# Patient Record
Sex: Female | Born: 2000 | Hispanic: No | Marital: Single | Smoking: Never smoker
Health system: Southern US, Community
[De-identification: ages and names within clinical notes are randomized; demographics above are authoritative.]

## PROBLEM LIST (undated history)

## (undated) DIAGNOSIS — J45909 Unspecified asthma, uncomplicated: Secondary | ICD-10-CM

## (undated) DIAGNOSIS — F32A Depression, unspecified: Secondary | ICD-10-CM

## (undated) HISTORY — PX: OTHER SURGICAL HISTORY: SHX169

---
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
## 2022-06-02 ENCOUNTER — Ambulatory Visit
Admission: RE | Admit: 2022-06-02 | Discharge: 2022-06-02 | Disposition: A | Discharge status: Home / Self Care | Payer: Medicaid Other | Source: Ambulatory Visit | Attending: Family Medicine | Admitting: Family Medicine

## 2022-06-02 VITALS — BP 116/77 | HR 92 | Temp 98.0°F | Resp 16

## 2022-06-02 DIAGNOSIS — J0140 Acute pansinusitis, unspecified: Secondary | ICD-10-CM | POA: Diagnosis not present

## 2022-06-02 DIAGNOSIS — J029 Acute pharyngitis, unspecified: Secondary | ICD-10-CM

## 2022-06-02 LAB — POCT RAPID STREP A (OFFICE): Rapid Strep A Screen: NEGATIVE

## 2022-06-02 MED ORDER — LEVOCETIRIZINE DIHYDROCHLORIDE 5 MG PO TABS: 5.0000 mg | ORAL_TABLET | Freq: Every evening | ORAL | 0 refills | Status: DC

## 2022-06-02 MED ORDER — AZITHROMYCIN 250 MG PO TABS: ORAL_TABLET | ORAL | 0 refills | Status: DC

## 2022-06-02 NOTE — ED Provider Notes (Signed)
Renaldo Fiddler    CSN: [id number] Arrival date & time: 06/02/22  1813      History   Chief Complaint Chief Complaint  Patient presents with   Sore Throat    Had a sore Throat for about 2weeks coughing up green mucus has a stuffy nose and puss on tonsils - Entered by patient    HPI Jamie Lindsey is a 21 y.o. female.   HPI Patient presents for evaluation of sore throat, stuffy nose, and coughing- up green mucous. She is not taking medication as she feels medication does not help her symptoms.  She reports allergy type symptoms intermittently for several months. She denies fever.   History reviewed. No pertinent past medical history.  There are no problems to display for this patient.   History reviewed. No pertinent surgical history.  OB History   No obstetric history on file.      Home Medications    Prior to Admission medications   Medication Sig Start Date End Date Taking? Authorizing Provider  azithromycin (ZITHROMAX) 250 MG tablet Take 2 tabs PO x 1 dose, then 1 tab PO QD x 4 days 06/02/22  Yes Bing Neighbors, FNP  levocetirizine (XYZAL) 5 MG tablet Take 1 tablet (5 mg total) by mouth every evening. 06/02/22  Yes Bing Neighbors, FNP  dicyclomine (BENTYL) 10 MG capsule Take 1 capsule (10 mg total) by mouth 4 (four) times daily for 5 days. 05/04/21 05/09/21  Gilles Chiquito, MD  escitalopram (LEXAPRO) 10 MG tablet Take 10 mg by mouth daily. Patient not taking: Reported on 05/16/2021    [provider]  escitalopram (LEXAPRO) 20 MG tablet Take 1 tablet by mouth daily. 02/01/21   [provider]  magic mouthwash w/lidocaine SOLN Take 5 mLs by mouth 4 (four) times daily as needed for mouth pain. Patient not taking: Reported on 05/16/2021 01/07/17   Darci Current, MD  ondansetron (ZOFRAN) 4 MG tablet Take 1 tablet (4 mg total) by mouth every 8 (eight) hours as needed for up to 10 doses for nausea or vomiting. 05/04/21   Gilles Chiquito, MD   ZAFEMY 150-35 MCG/24HR transdermal patch Place onto the skin. 04/01/21   [provider]    Family History History reviewed. No pertinent family history.  Social History Social History   Tobacco Use   Smoking status: Never   Smokeless tobacco: Never  Substance Use Topics   Alcohol use: No     Allergies   Tylenol [acetaminophen], Penicillins, and Tomato   Review of Systems Review of Systems Pertinent negatives listed in HPI   Physical Exam Triage Vital Signs ED Triage Vitals  Enc Vitals Group     BP 06/02/22 1900 116/77     Pulse Rate 06/02/22 1900 92     Resp 06/02/22 1900 16     Temp 06/02/22 1900 98 F (36.7 C)     Temp Source 06/02/22 1900 Temporal     SpO2 06/02/22 1900 100 %     Weight --      Height --      Head Circumference --      Peak Flow --      Pain Score 06/02/22 1859 0     Pain Loc --      Pain Edu? --      Excl. in GC? --    No data found.  Updated Vital Signs BP 116/77 (BP Location: Left Arm)   Pulse 92  Temp 98 F (36.7 C) (Temporal)   Resp 16   LMP 05/26/2022   SpO2 100%   Visual Acuity Right Eye Distance:   Left Eye Distance:   Bilateral Distance:    Right Eye Near:   Left Eye Near:    Bilateral Near:     Physical Exam Constitutional:      Appearance: She is well-developed.  HENT:     Head: Normocephalic and atraumatic.     Right Ear: Tympanic membrane and ear canal normal.     Left Ear: Tympanic membrane and ear canal normal.     Nose: Congestion and rhinorrhea present.     Mouth/Throat:     Pharynx: No posterior oropharyngeal erythema or uvula swelling.     Tonsils: Tonsillar exudate present.  Eyes:     Conjunctiva/sclera: Conjunctivae normal.     Pupils: Pupils are equal, round, and reactive to light.  Cardiovascular:     Rate and Rhythm: Normal rate and regular rhythm.  Pulmonary:     Effort: Pulmonary effort is normal.     Breath sounds: Normal breath sounds.  Musculoskeletal:     Cervical back:  Normal range of motion and neck supple.  Lymphadenopathy:     Cervical: Cervical adenopathy present.  Skin:    General: Skin is warm and dry.     Capillary Refill: Capillary refill takes less than 2 seconds.  Neurological:     General: No focal deficit present.     Mental Status: She is alert.  Psychiatric:        Mood and Affect: Mood normal.      UC Treatments / Results  Labs (all labs ordered are listed, but only abnormal results are displayed) Labs Reviewed  POCT RAPID STREP A (OFFICE)    EKG   Radiology No results found.  Procedures Procedures (including critical care time)  Medications Ordered in UC Medications - No data to display  Initial Impression / Assessment and Plan / UC Course  I have reviewed the triage vital signs and the nursing notes.  Pertinent labs & imaging results that were available during my care of the patient were reviewed by me and considered in my medical decision making (see chart for details).    Acute Non-Recurrent Pansinusitis  Acute Pharyngitis  Treatment with Azithromycin and Levocetirizine Hydrate well with fluids. Return for evaluation if symptoms worsen or do not improve. Final Clinical Impressions(s) / UC Diagnoses   Final diagnoses:  Acute non-recurrent pansinusitis  Acute pharyngitis, unspecified etiology   Discharge Instructions   None    ED Prescriptions     Medication Sig Dispense Auth. Provider   azithromycin (ZITHROMAX) 250 MG tablet Take 2 tabs PO x 1 dose, then 1 tab PO QD x 4 days 6 tablet Bing Neighbors, FNP   levocetirizine (XYZAL) 5 MG tablet Take 1 tablet (5 mg total) by mouth every evening. 30 tablet Bing Neighbors, FNP      PDMP not reviewed this encounter.   Bing Neighbors, FNP 06/02/22 1935

## 2022-06-02 NOTE — ED Triage Notes (Signed)
Patient presents to Urgent Care with complaints of sore throat, stuffy nose, and coughing up greenish mucous  x 2 weeks. Not taking any meds.

## 2022-06-05 ENCOUNTER — Ambulatory Visit
Admission: RE | Admit: 2022-06-05 | Discharge: 2022-06-05 | Disposition: A | Discharge status: Home / Self Care | Payer: Medicaid Other | Source: Ambulatory Visit | Attending: Emergency Medicine | Admitting: Emergency Medicine

## 2022-06-05 VITALS — BP 121/80 | HR 103 | Temp 97.6°F | Resp 18

## 2022-06-05 DIAGNOSIS — H1033 Unspecified acute conjunctivitis, bilateral: Secondary | ICD-10-CM | POA: Diagnosis not present

## 2022-06-05 MED ORDER — OFLOXACIN 0.3 % OP SOLN: 1.0000 [drp] | Freq: Four times a day (QID) | OPHTHALMIC | 0 refills | Status: DC

## 2022-06-05 NOTE — ED Triage Notes (Signed)
Patient presents to Urgent Care with complaints of possible pink eye to both eyes x 2 days. Seen Friday for cold like symptoms. Employer requesting she be seen by provider before returning.

## 2022-06-05 NOTE — Discharge Instructions (Addendum)
Use the eye drops as directed.  Follow up with your primary care provider if your symptoms are not improving.   ? ?

## 2022-06-05 NOTE — ED Provider Notes (Signed)
Renaldo Fiddler    CSN: [id number] Arrival date & time: 06/05/22  1349      History   Chief Complaint Chief Complaint  Patient presents with   Eye Problem    Came to get looked at on friday and i left and now i have pink eye - Entered by patient    HPI Diva Lemberger is a 21 y.o. female.  Patient presents with 2-day history of eye redness, itching, crusting in lashes, yellow drainage.  No eye injury, eye pain, changes in vision.  No fever, chills, difficulty breathing, or other symptoms.  She has been treating her symptoms at home with her brother's antibiotic eyedrops that were prescribed for his pinkeye; She does not know the name of these eyedrops.  Patient was seen at this urgent care on 06/02/2022; diagnosed with acute sinusitis and acute pharyngitis; treated with Zithromax and levocetirizine.  The history is provided by the patient and medical records.    History reviewed. No pertinent past medical history.  There are no problems to display for this patient.   History reviewed. No pertinent surgical history.  OB History   No obstetric history on file.      Home Medications    Prior to Admission medications   Medication Sig Start Date End Date Taking? Authorizing Provider  ofloxacin (OCUFLOX) 0.3 % ophthalmic solution Place 1 drop into both eyes 4 (four) times daily. 06/05/22  Yes Mickie Bail, NP  azithromycin (ZITHROMAX) 250 MG tablet Take 2 tabs PO x 1 dose, then 1 tab PO QD x 4 days 06/02/22   Bing Neighbors, FNP  dicyclomine (BENTYL) 10 MG capsule Take 1 capsule (10 mg total) by mouth 4 (four) times daily for 5 days. 05/04/21 05/09/21  Gilles Chiquito, MD  escitalopram (LEXAPRO) 10 MG tablet Take 10 mg by mouth daily. Patient not taking: Reported on 05/16/2021    [provider]  escitalopram (LEXAPRO) 20 MG tablet Take 1 tablet by mouth daily. 02/01/21   [provider]  levocetirizine (XYZAL) 5 MG tablet Take 1 tablet (5 mg total) by  mouth every evening. 06/02/22   Bing Neighbors, FNP  magic mouthwash w/lidocaine SOLN Take 5 mLs by mouth 4 (four) times daily as needed for mouth pain. Patient not taking: Reported on 05/16/2021 01/07/17   Darci Current, MD  ondansetron (ZOFRAN) 4 MG tablet Take 1 tablet (4 mg total) by mouth every 8 (eight) hours as needed for up to 10 doses for nausea or vomiting. 05/04/21   Gilles Chiquito, MD  ZAFEMY 150-35 MCG/24HR transdermal patch Place onto the skin. 04/01/21   [provider]    Family History History reviewed. No pertinent family history.  Social History Social History   Tobacco Use   Smoking status: Never   Smokeless tobacco: Never  Substance Use Topics   Alcohol use: No     Allergies   Tylenol [acetaminophen], Penicillins, and Tomato   Review of Systems Review of Systems  Constitutional:  Negative for chills and fever.  HENT:  Positive for congestion. Negative for ear pain and sore throat.   Eyes:  Positive for discharge, redness and itching. Negative for pain and visual disturbance.  Respiratory:  Positive for cough. Negative for shortness of breath.   Skin:  Negative for color change and rash.  All other systems reviewed and are negative.    Physical Exam Triage Vital Signs ED Triage Vitals  Enc Vitals Group  BP      Pulse      Resp      Temp      Temp src      SpO2      Weight      Height      Head Circumference      Peak Flow      Pain Score      Pain Loc      Pain Edu?      Excl. in GC?    No data found.  Updated Vital Signs BP 121/80 (BP Location: Left Arm)   Pulse (!) 103   Temp 97.6 F (36.4 C) (Temporal)   Resp 18   LMP 05/26/2022   SpO2 98%   Visual Acuity Right Eye Distance:   Left Eye Distance:   Bilateral Distance:    Right Eye Near:   Left Eye Near:    Bilateral Near:     Physical Exam Vitals and nursing note reviewed.  Constitutional:      General: She is not in acute distress.    Appearance: She  is well-developed. She is not ill-appearing.  HENT:     Right Ear: Tympanic membrane normal.     Left Ear: Tympanic membrane normal.     Nose: Rhinorrhea present.     Mouth/Throat:     Mouth: Mucous membranes are moist.     Pharynx: Oropharynx is clear.  Eyes:     General: Lids are normal. Vision grossly intact.        Right eye: No discharge.        Left eye: No discharge.     Extraocular Movements: Extraocular movements intact.     Conjunctiva/sclera:     Right eye: Right conjunctiva is injected.     Left eye: Left conjunctiva is injected.     Pupils: Pupils are equal, round, and reactive to light.  Cardiovascular:     Rate and Rhythm: Normal rate and regular rhythm.     Heart sounds: Normal heart sounds.  Pulmonary:     Effort: Pulmonary effort is normal. No respiratory distress.     Breath sounds: Normal breath sounds.  Musculoskeletal:     Cervical back: Neck supple.  Skin:    General: Skin is warm and dry.  Neurological:     Mental Status: She is alert.  Psychiatric:        Mood and Affect: Mood normal.        Behavior: Behavior normal.      UC Treatments / Results  Labs (all labs ordered are listed, but only abnormal results are displayed) Labs Reviewed - No data to display  EKG   Radiology No results found.  Procedures Procedures (including critical care time)  Medications Ordered in UC Medications - No data to display  Initial Impression / Assessment and Plan / UC Course  I have reviewed the triage vital signs and the nursing notes.  Pertinent labs & imaging results that were available during my care of the patient were reviewed by me and considered in my medical decision making (see chart for details).    Bilateral bacterial conjunctivitis.  Treating with ofloxacin eyedrops.  Education provided on bacterial conjunctivitis.  Instructed patient to follow-up with her PCP if her symptoms are not improving.  She agrees to plan of care  Final Clinical  Impressions(s) / UC Diagnoses   Final diagnoses:  Acute bacterial conjunctivitis of both eyes     Discharge Instructions  Use the eye drops as directed.  Follow up with your primary care provider if your symptoms are not improving.        ED Prescriptions     Medication Sig Dispense Auth. Provider   ofloxacin (OCUFLOX) 0.3 % ophthalmic solution Place 1 drop into both eyes 4 (four) times daily. 5 mL Mickie Bail, NP      PDMP not reviewed this encounter.   Mickie Bail, NP 06/05/22 1410

## 2022-07-17 DIAGNOSIS — Z3403 Encounter for supervision of normal first pregnancy, third trimester: Secondary | ICD-10-CM | POA: Insufficient documentation

## 2022-07-26 LAB — OB RESULTS CONSOLE VARICELLA ZOSTER ANTIBODY, IGG: Varicella: IMMUNE

## 2022-07-26 LAB — OB RESULTS CONSOLE RUBELLA ANTIBODY, IGM: Rubella: IMMUNE

## 2022-07-26 LAB — OB RESULTS CONSOLE HEPATITIS B SURFACE ANTIGEN: Hepatitis B Surface Ag: NEGATIVE

## 2022-10-26 ENCOUNTER — Other Ambulatory Visit: Payer: Self-pay | Admitting: Obstetrics and Gynecology

## 2022-10-26 ENCOUNTER — Observation Stay
Admission: EM | Admit: 2022-10-26 | Discharge: 2022-10-26 | Disposition: A | Discharge status: Home / Self Care | Payer: Medicaid Other | Attending: Obstetrics and Gynecology | Admitting: Obstetrics and Gynecology

## 2022-10-26 ENCOUNTER — Other Ambulatory Visit: Payer: Self-pay

## 2022-10-26 ENCOUNTER — Encounter: Payer: Self-pay | Admitting: Obstetrics and Gynecology

## 2022-10-26 DIAGNOSIS — O26892 Other specified pregnancy related conditions, second trimester: Secondary | ICD-10-CM | POA: Insufficient documentation

## 2022-10-26 DIAGNOSIS — Z1152 Encounter for screening for COVID-19: Secondary | ICD-10-CM | POA: Insufficient documentation

## 2022-10-26 DIAGNOSIS — R1030 Lower abdominal pain, unspecified: Secondary | ICD-10-CM | POA: Insufficient documentation

## 2022-10-26 DIAGNOSIS — Z3A23 23 weeks gestation of pregnancy: Secondary | ICD-10-CM | POA: Diagnosis not present

## 2022-10-26 DIAGNOSIS — O99891 Other specified diseases and conditions complicating pregnancy: Principal | ICD-10-CM | POA: Insufficient documentation

## 2022-10-26 DIAGNOSIS — J45909 Unspecified asthma, uncomplicated: Secondary | ICD-10-CM | POA: Insufficient documentation

## 2022-10-26 DIAGNOSIS — R519 Headache, unspecified: Secondary | ICD-10-CM | POA: Insufficient documentation

## 2022-10-26 DIAGNOSIS — R509 Fever, unspecified: Secondary | ICD-10-CM | POA: Diagnosis not present

## 2022-10-26 DIAGNOSIS — E876 Hypokalemia: Secondary | ICD-10-CM | POA: Diagnosis not present

## 2022-10-26 DIAGNOSIS — Z79899 Other long term (current) drug therapy: Secondary | ICD-10-CM | POA: Diagnosis not present

## 2022-10-26 DIAGNOSIS — O99512 Diseases of the respiratory system complicating pregnancy, second trimester: Secondary | ICD-10-CM | POA: Diagnosis not present

## 2022-10-26 DIAGNOSIS — O99282 Endocrine, nutritional and metabolic diseases complicating pregnancy, second trimester: Secondary | ICD-10-CM | POA: Insufficient documentation

## 2022-10-26 DIAGNOSIS — E871 Hypo-osmolality and hyponatremia: Secondary | ICD-10-CM | POA: Diagnosis not present

## 2022-10-26 HISTORY — DX: Unspecified asthma, uncomplicated: J45.909

## 2022-10-26 LAB — URINALYSIS, COMPLETE (UACMP) WITH MICROSCOPIC
Bacteria, UA: NONE SEEN
Bilirubin Urine: NEGATIVE
Glucose, UA: NEGATIVE mg/dL
Hgb urine dipstick: NEGATIVE
Ketones, ur: NEGATIVE mg/dL
Leukocytes,Ua: NEGATIVE
Nitrite: NEGATIVE
Protein, ur: NEGATIVE mg/dL
Specific Gravity, Urine: 1.02 (ref 1.005–1.030)
pH: 7 (ref 5.0–8.0)

## 2022-10-26 LAB — RESP PANEL BY RT-PCR (FLU A&B, COVID) ARPGX2
Influenza A by PCR: NEGATIVE
Influenza B by PCR: NEGATIVE
SARS Coronavirus 2 by RT PCR: NEGATIVE

## 2022-10-26 LAB — COMPREHENSIVE METABOLIC PANEL
ALT: 23 U/L (ref 0–44)
AST: 29 U/L (ref 15–41)
Albumin: 3.3 g/dL — ABNORMAL LOW (ref 3.5–5.0)
Alkaline Phosphatase: 70 U/L (ref 38–126)
Anion gap: 8 (ref 5–15)
BUN: 9 mg/dL (ref 6–20)
CO2: 20 mmol/L — ABNORMAL LOW (ref 22–32)
Calcium: 8.8 mg/dL — ABNORMAL LOW (ref 8.9–10.3)
Chloride: 104 mmol/L (ref 98–111)
Creatinine, Ser: 0.64 mg/dL (ref 0.44–1.00)
GFR, Estimated: >60 mL/min (ref >60)
Glucose, Bld: 99 mg/dL (ref 70–99)
Potassium: 3 mmol/L — ABNORMAL LOW (ref 3.5–5.1)
Sodium: 132 mmol/L — ABNORMAL LOW (ref 135–145)
Total Bilirubin: 0.7 mg/dL (ref 0.3–1.2)
Total Protein: 7.3 g/dL (ref 6.5–8.1)

## 2022-10-26 LAB — LACTIC ACID, PLASMA
Lactic Acid, Venous: 1.3 mmol/L (ref 0.5–1.9)
Lactic Acid, Venous: 2.1 mmol/L (ref 0.5–1.9)

## 2022-10-26 LAB — CBC
HCT: 31 % — ABNORMAL LOW (ref 36.0–46.0)
Hemoglobin: 10.6 g/dL — ABNORMAL LOW (ref 12.0–15.0)
MCH: 31 pg (ref 26.0–34.0)
MCHC: 34.2 g/dL (ref 30.0–36.0)
MCV: 90.6 fL (ref 80.0–100.0)
Platelets: 270 10*3/uL (ref 150–400)
RBC: 3.42 MIL/uL — ABNORMAL LOW (ref 3.87–5.11)
RDW: 13.5 % (ref 11.5–15.5)
WBC: 13.6 10*3/uL — ABNORMAL HIGH (ref 4.0–10.5)
nRBC: 0 % (ref 0.0–0.2)

## 2022-10-26 MED ORDER — LACTATED RINGERS IV BOLUS
1000.0000 mL | Freq: Once | INTRAVENOUS | Status: AC
  Administered 2022-10-26: 1000 mL via INTRAVENOUS

## 2022-10-26 MED ORDER — POTASSIUM CHLORIDE CRYS ER 10 MEQ PO TBCR
40.0000 meq | EXTENDED_RELEASE_TABLET | Freq: Two times a day (BID) | ORAL | Status: DC
  Administered 2022-10-26: 40 meq via ORAL
  Filled 2022-10-26: qty 4

## 2022-10-26 MED ORDER — IBUPROFEN 600 MG PO TABS
600.0000 mg | ORAL_TABLET | Freq: Four times a day (QID) | ORAL | Status: DC | PRN
  Administered 2022-10-26: 600 mg via ORAL
  Filled 2022-10-26: qty 1

## 2022-10-26 MED ORDER — LACTATED RINGERS IV SOLN: INTRAVENOUS | Status: DC

## 2022-10-26 NOTE — OB Triage Note (Signed)
Patient is a  21 yo, G1P0, at 23 weeks 1 day. Patient presents with complaints of head, back and abdominal pain since last night.  Patient denies any vaginal bleeding or LOF. Patient report FM. Monitors applied and assessing. Patient presents with a fever of 101.9 but all other viatl signs are stable. Initial fetal heart tone 165.Mackie CNM notified of patients arrival to unit.

## 2022-10-26 NOTE — Progress Notes (Signed)
Jamie Lindsey is a 21 y.o. G1P0 at [redacted]w[redacted]d by LMP admitted for fever and general malaise and myalgias   Subjective:  Symptoms started yesterday She has not been around anybody that has been sick  Initial temp at L+D 101.9 Objective: BP (!) 105/46   Pulse (!) 111   Temp 98.6 F (37 C) (Oral)   Ht 5\' 3"  (1.6 m)   Wt 60.8 kg   LMP 05/19/2022   SpO2 99%   BMI 23.74 kg/m  Lung CTA , no rales  CV Tachy no murmur   And soft , NT , gravid .  No CVAT :     FH 160's Labs: Lab Results  Component Value Date   WBC 13.6 (H) 10/26/2022   HGB 10.6 (L) 10/26/2022   HCT 31.0 (L) 10/26/2022   MCV 90.6 10/26/2022   PLT 270 10/26/2022   Results for orders placed or performed during the hospital encounter of 10/26/22 (from the past 24 hour(s))  Urinalysis, Complete w Microscopic     Status: Abnormal   Collection Time: 10/26/22  8:03 AM  Result Value Ref Range   Color, Urine YELLOW (A) YELLOW   APPearance HAZY (A) CLEAR   Specific Gravity, Urine 1.020 1.005 - 1.030   pH 7.0 5.0 - 8.0   Glucose, UA NEGATIVE NEGATIVE mg/dL   Hgb urine dipstick NEGATIVE NEGATIVE   Bilirubin Urine NEGATIVE NEGATIVE   Ketones, ur NEGATIVE NEGATIVE mg/dL   Protein, ur NEGATIVE NEGATIVE mg/dL   Nitrite NEGATIVE NEGATIVE   Leukocytes,Ua NEGATIVE NEGATIVE   RBC / HPF 0-5 0 - 5 RBC/hpf   WBC, UA 0-5 0 - 5 WBC/hpf   Bacteria, UA NONE SEEN NONE SEEN   Squamous Epithelial / LPF 6-10 0 - 5   Mucus PRESENT   Comprehensive metabolic panel     Status: Abnormal   Collection Time: 10/26/22  8:40 AM  Result Value Ref Range   Sodium 132 (L) 135 - 145 mmol/L   Potassium 3.0 (L) 3.5 - 5.1 mmol/L   Chloride 104 98 - 111 mmol/L   CO2 20 (L) 22 - 32 mmol/L   Glucose, Bld 99 70 - 99 mg/dL   BUN 9 6 - 20 mg/dL   Creatinine, Ser 0.64 0.44 - 1.00 mg/dL   Calcium 8.8 (L) 8.9 - 10.3 mg/dL   Total Protein 7.3 6.5 - 8.1 g/dL   Albumin 3.3 (L) 3.5 - 5.0 g/dL   AST 29 15 - 41 U/L   ALT 23 0 - 44 U/L   Alkaline Phosphatase  70 38 - 126 U/L   Total Bilirubin 0.7 0.3 - 1.2 mg/dL   GFR, Estimated >60 >60 mL/min   Anion gap 8 5 - 15  CBC     Status: Abnormal   Collection Time: 10/26/22  8:40 AM  Result Value Ref Range   WBC 13.6 (H) 4.0 - 10.5 K/uL   RBC 3.42 (L) 3.87 - 5.11 MIL/uL   Hemoglobin 10.6 (L) 12.0 - 15.0 g/dL   HCT 31.0 (L) 36.0 - 46.0 %   MCV 90.6 80.0 - 100.0 fL   MCH 31.0 26.0 - 34.0 pg   MCHC 34.2 30.0 - 36.0 g/dL   RDW 13.5 11.5 - 15.5 %   Platelets 270 150 - 400 K/uL   nRBC 0.0 0.0 - 0.2 %  Lactic acid, plasma     Status: None   Collection Time: 10/26/22  8:40 AM  Result Value Ref Range   Lactic Acid,  Venous 1.3 0.5 - 1.9 mmol/L  Resp Panel by RT-PCR (Flu A&B, Covid) Anterior Nasal Swab     Status: None   Collection Time: 10/26/22  8:46 AM   Specimen: Anterior Nasal Swab  Result Value Ref Range   SARS Coronavirus 2 by RT PCR NEGATIVE NEGATIVE   Influenza A by PCR NEGATIVE NEGATIVE   Influenza B by PCR NEGATIVE NEGATIVE     Assessment / Plan: 23+1 week Viral syndrome , not Covid , no Influenza.  No sepsis based on normal LActic acid  Hypokalemia Hypo natremia P:  KCL 40 meq po given  as well as tylenol . 2 liter IVF bolus given . Defervesced.  Pt eat normal meal here K+ rick foods at home  Will d/c home with conservative management  Precaution to RTC given     Suzy Bouchard, MD 10/26/2022, 11:28 AM

## 2022-10-26 NOTE — Discharge Summary (Signed)
Jamie Lindsey is a 21 y.o. female. She is at [redacted]w[redacted]d gestation. Patient's last menstrual period was 05/19/2022. Estimated Date of Delivery: 02/21/23  Prenatal care site: [facility]  S:fever and malaise   Maternal Medical History:   Past Medical History:  Diagnosis Date   Asthma     History reviewed. No pertinent surgical history.  Allergies  Allergen Reactions   Tylenol [Acetaminophen] Hives   Penicillins Hives   Tomato     Prior to Admission medications   Medication Sig Start Date End Date Taking? Authorizing Provider  azithromycin (ZITHROMAX) 250 MG tablet Take 2 tabs PO x 1 dose, then 1 tab PO QD x 4 days 06/02/22   Scot Jun, FNP  dicyclomine (BENTYL) 10 MG capsule Take 1 capsule (10 mg total) by mouth 4 (four) times daily for 5 days. 05/04/21 05/09/21  Lucrezia Starch, MD  escitalopram (LEXAPRO) 10 MG tablet Take 10 mg by mouth daily.    [provider]  escitalopram (LEXAPRO) 20 MG tablet Take 1 tablet by mouth daily. Patient not taking: Reported on 10/26/2022 02/01/21   [provider]  levocetirizine (XYZAL) 5 MG tablet Take 1 tablet (5 mg total) by mouth every evening. 06/02/22   Scot Jun, FNP  magic mouthwash w/lidocaine SOLN Take 5 mLs by mouth 4 (four) times daily as needed for mouth pain. 01/07/17   Gregor Hams, MD  ofloxacin (OCUFLOX) 0.3 % ophthalmic solution Place 1 drop into both eyes 4 (four) times daily. 06/05/22   Sharion Balloon, NP  ondansetron (ZOFRAN) 4 MG tablet Take 1 tablet (4 mg total) by mouth every 8 (eight) hours as needed for up to 10 doses for nausea or vomiting. 05/04/21   Lucrezia Starch, MD  ZAFEMY 150-35 MCG/24HR transdermal patch Place onto the skin. 04/01/21   [provider]     Social History: She  reports that she has never smoked. She has never used smokeless tobacco. She reports that she does not drink alcohol and does not use drugs.  Family History: family history is not on file.   Review of Systems: A full review of systems was performed and negative except as noted in the HPI.     O:  BP (!) 105/46   Pulse (!) 111   Temp 98.6 F (37 C) (Oral)   Ht 5\' 3"  (1.6 m)   Wt 60.8 kg   LMP 05/19/2022   SpO2 99%   BMI 23.74 kg/m  Results for orders placed or performed during the hospital encounter of 10/26/22 (from the past 48 hour(s))  Urinalysis, Complete w Microscopic   Collection Time: 10/26/22  8:03 AM  Result Value Ref Range   Color, Urine YELLOW (A) YELLOW   APPearance HAZY (A) CLEAR   Specific Gravity, Urine 1.020 1.005 - 1.030   pH 7.0 5.0 - 8.0   Glucose, UA NEGATIVE NEGATIVE mg/dL   Hgb urine dipstick NEGATIVE NEGATIVE   Bilirubin Urine NEGATIVE NEGATIVE   Ketones, ur NEGATIVE NEGATIVE mg/dL   Protein, ur NEGATIVE NEGATIVE mg/dL   Nitrite NEGATIVE NEGATIVE   Leukocytes,Ua NEGATIVE NEGATIVE   RBC / HPF 0-5 0 - 5 RBC/hpf   WBC, UA 0-5 0 - 5 WBC/hpf   Bacteria, UA NONE SEEN NONE SEEN   Squamous Epithelial / LPF 6-10 0 - 5   Mucus PRESENT   Comprehensive metabolic panel   Collection Time: 10/26/22  8:40 AM  Result Value Ref Range   Sodium 132 (  L) 135 - 145 mmol/L   Potassium 3.0 (L) 3.5 - 5.1 mmol/L   Chloride 104 98 - 111 mmol/L   CO2 20 (L) 22 - 32 mmol/L   Glucose, Bld 99 70 - 99 mg/dL   BUN 9 6 - 20 mg/dL   Creatinine, Ser 0.64 0.44 - 1.00 mg/dL   Calcium 8.8 (L) 8.9 - 10.3 mg/dL   Total Protein 7.3 6.5 - 8.1 g/dL   Albumin 3.3 (L) 3.5 - 5.0 g/dL   AST 29 15 - 41 U/L   ALT 23 0 - 44 U/L   Alkaline Phosphatase 70 38 - 126 U/L   Total Bilirubin 0.7 0.3 - 1.2 mg/dL   GFR, Estimated >60 >60 mL/min   Anion gap 8 5 - 15  CBC   Collection Time: 10/26/22  8:40 AM  Result Value Ref Range   WBC 13.6 (H) 4.0 - 10.5 K/uL   RBC 3.42 (L) 3.87 - 5.11 MIL/uL   Hemoglobin 10.6 (L) 12.0 - 15.0 g/dL   HCT 31.0 (L) 36.0 - 46.0 %   MCV 90.6 80.0 - 100.0 fL   MCH 31.0 26.0 - 34.0 pg   MCHC 34.2 30.0 - 36.0 g/dL   RDW 13.5 11.5 - 15.5 %   Platelets  270 150 - 400 K/uL   nRBC 0.0 0.0 - 0.2 %  Lactic acid, plasma   Collection Time: 10/26/22  8:40 AM  Result Value Ref Range   Lactic Acid, Venous 1.3 0.5 - 1.9 mmol/L  Resp Panel by RT-PCR (Flu A&B, Covid) Anterior Nasal Swab   Collection Time: 10/26/22  8:46 AM   Specimen: Anterior Nasal Swab  Result Value Ref Range   SARS Coronavirus 2 by RT PCR NEGATIVE NEGATIVE   Influenza A by PCR NEGATIVE NEGATIVE   Influenza B by PCR NEGATIVE NEGATIVE     Constitutional: NAD, AAOx3  HE/ENT: extraocular movements grossly intact, moist mucous membranes CV: RRR PULM: nl respiratory effort, CTABL     Abd: gravid, non-tender, non-distended, soft      Ext: Non-tender, Nonedmeatous   Psych: mood appropriate, speech normal   FHR 160's   Assessment: 21 y.o. [redacted]w[redacted]d viral syndrome, no Covid , no influeunza Hypokalemia  hyponatremia Principle diagnosis:   Plan:  K+ replacement  2 liter IVF bolus D/c home with conservative tx for viral syndrome  ----- Huel Cote MD Attending Obstetrician and Gynecologist [facility], Department of [facility]

## 2022-10-26 NOTE — OB Triage Provider Note (Signed)
Carolene Gitto is a 21 y.o. female. She is at [redacted]w[redacted]d gestation. Patient's last menstrual period was 05/19/2022. 02/21/2023, by Ultrasound   Prenatal care site: [facility]  Chief complaint: headache, back pain, general malaise   HPI: Argelia presents to L&D with complaints of severe headache radiating down to her back.  She states symptoms started last night at 1800.  She has body aches, chills, fatigue, and severe headache.  Back pain is lower to middle back.  Reports some lower abdominal pain but states that her headache and back pain are her worst pain.  Denies change in fetal movement.  Denies cramping or contractions, LOF, or vaginal bleeding.  Denies N/V/D.   Factors complicating pregnancy: Anxiety and depression Teen pregnancy  Chlamydia in pregnancy - TOC negative   S: Curled up on side and rocking back and forth. no CTX, no VB.no LOF,  Active fetal movement.   Maternal Medical History:  Past Medical Hx:  has a past medical history of Asthma.    Past Surgical Hx:  has no past surgical history on file.   Allergies  Allergen Reactions   Tylenol [Acetaminophen] Hives   Penicillins Hives   Tomato      Prior to Admission medications   Medication Sig Start Date End Date Taking? Authorizing Provider  azithromycin (ZITHROMAX) 250 MG tablet Take 2 tabs PO x 1 dose, then 1 tab PO QD x 4 days 06/02/22   Bing Neighbors, FNP  dicyclomine (BENTYL) 10 MG capsule Take 1 capsule (10 mg total) by mouth 4 (four) times daily for 5 days. 05/04/21 05/09/21  Gilles Chiquito, MD  escitalopram (LEXAPRO) 10 MG tablet Take 10 mg by mouth daily.    [provider]  escitalopram (LEXAPRO) 20 MG tablet Take 1 tablet by mouth daily. Patient not taking: Reported on 10/26/2022 02/01/21   [provider]  levocetirizine (XYZAL) 5 MG tablet Take 1 tablet (5 mg total) by mouth every evening. 06/02/22   Bing Neighbors, FNP  magic mouthwash w/lidocaine SOLN Take 5 mLs by mouth 4  (four) times daily as needed for mouth pain. 01/07/17   Darci Current, MD  ofloxacin (OCUFLOX) 0.3 % ophthalmic solution Place 1 drop into both eyes 4 (four) times daily. 06/05/22   Mickie Bail, NP  ondansetron (ZOFRAN) 4 MG tablet Take 1 tablet (4 mg total) by mouth every 8 (eight) hours as needed for up to 10 doses for nausea or vomiting. 05/04/21   Gilles Chiquito, MD  ZAFEMY 150-35 MCG/24HR transdermal patch Place onto the skin. 04/01/21   [provider]    Social History: She  reports that she has never smoked. She has never used smokeless tobacco. She reports that she does not drink alcohol and does not use drugs.  Family History: family history reviewed, no history of gyn cancers   Review of Systems: A full review of systems was performed and negative except as noted in the HPI.     Pertinent Results:  Prenatal Labs: Blood type/Rh O positive    Antibody screen Negative    Rubella Immune    Varicella Immune  RPR NR    HBsAg NR   Hep C NR   HIV NR    GC neg  Chlamydia Pos - TOC neg   Genetic screening cfDNA negative     O:  BP (!) 109/53   Pulse (!) 125   Temp (!) 101.9 F (38.8 C) (Oral)   Ht  5\' 3"  (1.6 m)   Wt 60.8 kg   LMP 05/19/2022   SpO2 100%   BMI 23.74 kg/m  Results for orders placed or performed during the hospital encounter of 10/26/22 (from the past 48 hour(s))  Urinalysis, Complete w Microscopic   Collection Time: 10/26/22  8:03 AM  Result Value Ref Range   Color, Urine YELLOW (A) YELLOW   APPearance HAZY (A) CLEAR   Specific Gravity, Urine 1.020 1.005 - 1.030   pH 7.0 5.0 - 8.0   Glucose, UA NEGATIVE NEGATIVE mg/dL   Hgb urine dipstick NEGATIVE NEGATIVE   Bilirubin Urine NEGATIVE NEGATIVE   Ketones, ur NEGATIVE NEGATIVE mg/dL   Protein, ur NEGATIVE NEGATIVE mg/dL   Nitrite NEGATIVE NEGATIVE   Leukocytes,Ua NEGATIVE NEGATIVE   RBC / HPF 0-5 0 - 5 RBC/hpf   WBC, UA 0-5 0 - 5 WBC/hpf   Bacteria, UA NONE SEEN NONE SEEN   Squamous  Epithelial / LPF 6-10 0 - 5   Mucus PRESENT      Constitutional: mild distress, AAOx3  HE/ENT: extraocular movements grossly intact, moist mucous membranes CV: RRR PULM: nl respiratory effort MS: lower back tender to palpation  Abd: gravid, non-tender, non-distended, soft, bilateral CVA tenderness  Ext: Non-tender, Nonedmeatous Psych: mood appropriate, speech normal Pelvic : deferred  Fetal assessment: Doppler: 165 beats/min Tocometry: Quiet  Assessment: 21 y.o. G1P0 [redacted]w[redacted]d 02/21/2023, by Ultrasound   Principle diagnosis: Headache, flank pain, fever in pregnancy, possible sepsis   Plan: 1) Fetal status   -Doppler 165bpm - fetal tachycardia c/w maternal fever  -Doppler FHT q 4 hours  -Continuous toco   2) Maternal fever with general malaise   -Plan of care discussed with Dr. Ouida Sills  -Allergy to tylenol - will use Ibuprofen  -UA negative  -Sepsis work up to include fluid resuscitation, blood culture x 2, CBC, CMP, lactid acid  -Continuous pulse ox  -Respiratory panel ordered  -Contact precautions   ----- Drinda Butts, CNM Certified Nurse Midwife [facility]

## 2022-10-26 NOTE — OB Triage Note (Signed)
Discharge instructions, labor precautions, and follow-up care reviewed with patient and significant other. All questions answered. Patient verbalized understanding. Discharged ambulatory off unit.   

## 2022-10-31 LAB — CULTURE, BLOOD (ROUTINE X 2)
Culture: NO GROWTH
Culture: NO GROWTH
Special Requests: ADEQUATE

## 2022-12-25 NOTE — L&D Delivery Note (Signed)
Delivery Note  Jamie Lindsey is a G1P1001 at 45w1dwith an LMP of 05/17/22, consistent with [place]at 475w1d  First Stage: Labor onset: 0100 Analgesia /Anesthesia intrapartum: epidural SROM at 0715 GBS: negative IP Antibiotics: none  Second Stage: Complete dilation at 0840 Onset of pushing at 0852 FHR second stage 125 bpm with moderate, variable decels with pushing   [name] to L&D with spontaneous labor. She was 6/80/0. She progressed  to C/C/+2 with a spontaneous urge to push.  She pushed  effectively over approximately 8 minutes for a spontaneous vaginal birth.  Delivery of a viable baby girl on 03/01/23 at 0900 by CNM Delivery of fetal head in OA position with restitution to LOT. no nuchal cord, but true knot identified;  Anterior then posterior shoulders delivered easily with gentle downward traction. Baby placed on mom's chest, and attended to by baby RN Cord double clamped after cessation of pulsation, cut by FOB  Cord blood sample collection: Yes O POS Collection of cord blood donation no Arterial cord blood sample  no  Third Stage: Oxytocin bolus started after delivery of infant for hemorrhage prophylaxis  Placenta delivered schultz intact with 3 VC @ 0905 Placenta disposition: discarded per protocol Uterine tone firm / bleeding moderate  2nd degree vaginal and 1st degree right labial laceration identified  Anesthesia for repair: epidural Repair  2-0, 3-0 vicryl Est. Blood Loss (mL): [id number]: none  Mom to postpartum.  Baby to Couplet care / Skin to Skin.  Newborn: Information for the patient's newborn:  [name], [name] born female "[name]" Birth Weight: 7 lb 0.2 oz (3180 g) APGAR: 7, 9  Newborn Delivery   Birth date/time: 03/01/2023 09:00:00 Delivery type: Vaginal, Spontaneous       Feeding planned: formula feeding  ---------- FeAvelino LeedsCNM Certified Nurse Midwife [facility]

## 2023-01-29 LAB — OB RESULTS CONSOLE RPR: RPR: NONREACTIVE

## 2023-01-29 LAB — OB RESULTS CONSOLE GBS: GBS: NEGATIVE

## 2023-01-29 LAB — OB RESULTS CONSOLE HIV ANTIBODY (ROUTINE TESTING): HIV: NONREACTIVE

## 2023-01-29 LAB — OB RESULTS CONSOLE GC/CHLAMYDIA
Chlamydia: NEGATIVE
Neisseria Gonorrhea: NEGATIVE

## 2023-01-30 ENCOUNTER — Other Ambulatory Visit: Payer: Self-pay

## 2023-01-30 DIAGNOSIS — D509 Iron deficiency anemia, unspecified: Secondary | ICD-10-CM

## 2023-01-30 NOTE — Progress Notes (Signed)
Maternal Iron deficiency anemia in pregnancy.   -Needs iron transfusions weekly x 3.    Drinda Butts, CNM Certified Nurse Midwife [facility]

## 2023-02-01 ENCOUNTER — Ambulatory Visit
Admission: RE | Admit: 2023-02-01 | Discharge: 2023-02-01 | Disposition: A | Discharge status: Home / Self Care | Payer: Medicaid Other | Source: Ambulatory Visit

## 2023-02-01 DIAGNOSIS — O99013 Anemia complicating pregnancy, third trimester: Secondary | ICD-10-CM | POA: Diagnosis present

## 2023-02-01 DIAGNOSIS — D509 Iron deficiency anemia, unspecified: Secondary | ICD-10-CM | POA: Diagnosis not present

## 2023-02-01 DIAGNOSIS — Z3A36 36 weeks gestation of pregnancy: Secondary | ICD-10-CM | POA: Diagnosis not present

## 2023-02-01 MED ORDER — SODIUM CHLORIDE 0.9 % IV SOLN
100.0000 mg | INTRAVENOUS | Status: DC
  Administered 2023-02-01: 100 mg via INTRAVENOUS
  Filled 2023-02-01: qty 5

## 2023-02-07 ENCOUNTER — Ambulatory Visit
Admission: RE | Admit: 2023-02-07 | Discharge: 2023-02-07 | Disposition: A | Discharge status: Home / Self Care | Payer: Medicaid Other | Source: Ambulatory Visit

## 2023-02-07 DIAGNOSIS — Z3A00 Weeks of gestation of pregnancy not specified: Secondary | ICD-10-CM | POA: Diagnosis not present

## 2023-02-07 DIAGNOSIS — D509 Iron deficiency anemia, unspecified: Secondary | ICD-10-CM | POA: Insufficient documentation

## 2023-02-07 DIAGNOSIS — O99013 Anemia complicating pregnancy, third trimester: Secondary | ICD-10-CM | POA: Insufficient documentation

## 2023-02-07 MED ORDER — SODIUM CHLORIDE 0.9 % IV SOLN
100.0000 mg | Freq: Once | INTRAVENOUS | Status: AC
  Administered 2023-02-07: 100 mg via INTRAVENOUS
  Filled 2023-02-07: qty 5

## 2023-02-13 ENCOUNTER — Ambulatory Visit
Admission: RE | Admit: 2023-02-13 | Discharge: 2023-02-13 | Disposition: A | Discharge status: Home / Self Care | Payer: Medicaid Other | Source: Ambulatory Visit

## 2023-02-13 DIAGNOSIS — O99012 Anemia complicating pregnancy, second trimester: Secondary | ICD-10-CM | POA: Insufficient documentation

## 2023-02-13 DIAGNOSIS — D509 Iron deficiency anemia, unspecified: Secondary | ICD-10-CM | POA: Diagnosis not present

## 2023-02-13 DIAGNOSIS — Z3A36 36 weeks gestation of pregnancy: Secondary | ICD-10-CM | POA: Diagnosis not present

## 2023-02-13 DIAGNOSIS — O99013 Anemia complicating pregnancy, third trimester: Secondary | ICD-10-CM | POA: Diagnosis present

## 2023-02-13 MED ORDER — SODIUM CHLORIDE 0.9 % IV SOLN
100.0000 mg | Freq: Once | INTRAVENOUS | Status: AC
  Administered 2023-02-13: 100 mg via INTRAVENOUS
  Filled 2023-02-13: qty 100

## 2023-03-01 ENCOUNTER — Inpatient Hospital Stay
Admission: EM | Admit: 2023-03-01 | Discharge: 2023-03-02 | DRG: 807 | Disposition: A | Discharge status: Home / Self Care | Payer: Medicaid Other

## 2023-03-01 ENCOUNTER — Inpatient Hospital Stay: Payer: Medicaid Other | Admitting: Anesthesiology

## 2023-03-01 ENCOUNTER — Other Ambulatory Visit: Payer: Self-pay

## 2023-03-01 ENCOUNTER — Encounter: Payer: Self-pay | Admitting: Obstetrics and Gynecology

## 2023-03-01 DIAGNOSIS — Z880 Allergy status to penicillin: Secondary | ICD-10-CM

## 2023-03-01 DIAGNOSIS — O9902 Anemia complicating childbirth: Secondary | ICD-10-CM | POA: Diagnosis present

## 2023-03-01 DIAGNOSIS — O480 Post-term pregnancy: Principal | ICD-10-CM | POA: Diagnosis present

## 2023-03-01 DIAGNOSIS — D509 Iron deficiency anemia, unspecified: Secondary | ICD-10-CM | POA: Diagnosis present

## 2023-03-01 DIAGNOSIS — Z3A41 41 weeks gestation of pregnancy: Secondary | ICD-10-CM

## 2023-03-01 HISTORY — DX: Depression, unspecified: F32.A

## 2023-03-01 LAB — ABO/RH: ABO/RH(D): O POS

## 2023-03-01 LAB — CBC WITH DIFFERENTIAL/PLATELET
Abs Immature Granulocytes: 0.09 10*3/uL — ABNORMAL HIGH (ref 0.00–0.07)
Basophils Absolute: 0 10*3/uL (ref 0.0–0.1)
Basophils Relative: 0 %
Eosinophils Absolute: 0.1 10*3/uL (ref 0.0–0.5)
Eosinophils Relative: 1 %
HCT: 32.8 % — ABNORMAL LOW (ref 36.0–46.0)
Hemoglobin: 10.7 g/dL — ABNORMAL LOW (ref 12.0–15.0)
Immature Granulocytes: 1 %
Lymphocytes Relative: 9 %
Lymphs Abs: 1.4 10*3/uL (ref 0.7–4.0)
MCH: 28.2 pg (ref 26.0–34.0)
MCHC: 32.6 g/dL (ref 30.0–36.0)
MCV: 86.5 fL (ref 80.0–100.0)
Monocytes Absolute: 1.1 10*3/uL — ABNORMAL HIGH (ref 0.1–1.0)
Monocytes Relative: 7 %
Neutro Abs: 13.4 10*3/uL — ABNORMAL HIGH (ref 1.7–7.7)
Neutrophils Relative %: 82 %
Platelets: 288 10*3/uL (ref 150–400)
RBC: 3.79 MIL/uL — ABNORMAL LOW (ref 3.87–5.11)
RDW: 16.9 % — ABNORMAL HIGH (ref 11.5–15.5)
WBC: 16.1 10*3/uL — ABNORMAL HIGH (ref 4.0–10.5)
nRBC: 0 % (ref 0.0–0.2)

## 2023-03-01 LAB — TYPE AND SCREEN
ABO/RH(D): O POS
Antibody Screen: NEGATIVE

## 2023-03-01 LAB — RPR: RPR Ser Ql: NONREACTIVE

## 2023-03-01 MED ORDER — LIDOCAINE HCL (PF) 1 % IJ SOLN
INTRAMUSCULAR | Status: DC | PRN
  Administered 2023-03-01: 3 mL via SUBCUTANEOUS

## 2023-03-01 MED ORDER — BUPIVACAINE HCL (PF) 0.25 % IJ SOLN
INTRAMUSCULAR | Status: DC | PRN
  Administered 2023-03-01 (×2): 4 mL via EPIDURAL

## 2023-03-01 MED ORDER — SODIUM CHLORIDE 0.9 % IV SOLN: 250.0000 mL | INTRAVENOUS | Status: DC | PRN

## 2023-03-01 MED ORDER — BENZOCAINE-MENTHOL 20-0.5 % EX AERO
1.0000 | INHALATION_SPRAY | CUTANEOUS | Status: DC | PRN
  Administered 2023-03-01: 1 via TOPICAL
  Filled 2023-03-01: qty 56

## 2023-03-01 MED ORDER — ZOLPIDEM TARTRATE 5 MG PO TABS: 5.0000 mg | ORAL_TABLET | Freq: Every evening | ORAL | Status: DC | PRN

## 2023-03-01 MED ORDER — SENNOSIDES-DOCUSATE SODIUM 8.6-50 MG PO TABS
2.0000 | ORAL_TABLET | ORAL | Status: DC
  Administered 2023-03-01: 2 via ORAL
  Filled 2023-03-01: qty 2

## 2023-03-01 MED ORDER — COCONUT OIL OIL: 1.0000 | TOPICAL_OIL | Status: DC | PRN

## 2023-03-01 MED ORDER — ONDANSETRON HCL 4 MG/2ML IJ SOLN: 4.0000 mg | INTRAMUSCULAR | Status: DC | PRN

## 2023-03-01 MED ORDER — ONDANSETRON HCL 4 MG/2ML IJ SOLN: 4.0000 mg | Freq: Four times a day (QID) | INTRAMUSCULAR | Status: DC | PRN

## 2023-03-01 MED ORDER — OXYTOCIN 10 UNIT/ML IJ SOLN
INTRAMUSCULAR | Status: AC
  Filled 2023-03-01: qty 2

## 2023-03-01 MED ORDER — LACTATED RINGERS IV SOLN: 500.0000 mL | INTRAVENOUS | Status: DC | PRN

## 2023-03-01 MED ORDER — DIPHENHYDRAMINE HCL 25 MG PO CAPS: 25.0000 mg | ORAL_CAPSULE | Freq: Four times a day (QID) | ORAL | Status: DC | PRN

## 2023-03-01 MED ORDER — WITCH HAZEL-GLYCERIN EX PADS
1.0000 | MEDICATED_PAD | CUTANEOUS | Status: DC | PRN
  Administered 2023-03-01: 1 via TOPICAL
  Filled 2023-03-01: qty 100

## 2023-03-01 MED ORDER — PHENYLEPHRINE 80 MCG/ML (10ML) SYRINGE FOR IV PUSH (FOR BLOOD PRESSURE SUPPORT): 80.0000 ug | PREFILLED_SYRINGE | INTRAVENOUS | Status: DC | PRN

## 2023-03-01 MED ORDER — LIDOCAINE HCL (PF) 1 % IJ SOLN: 30.0000 mL | INTRAMUSCULAR | Status: DC | PRN

## 2023-03-01 MED ORDER — FENTANYL-BUPIVACAINE-NACL 0.5-0.125-0.9 MG/250ML-% EP SOLN
EPIDURAL | Status: DC | PRN
  Administered 2023-03-01: 12 mL/h via EPIDURAL

## 2023-03-01 MED ORDER — LIDOCAINE HCL (PF) 1 % IJ SOLN
INTRAMUSCULAR | Status: AC
  Filled 2023-03-01: qty 30

## 2023-03-01 MED ORDER — DIPHENHYDRAMINE HCL 50 MG/ML IJ SOLN: 12.5000 mg | INTRAMUSCULAR | Status: DC | PRN

## 2023-03-01 MED ORDER — OXYCODONE HCL 5 MG PO TABS
5.0000 mg | ORAL_TABLET | ORAL | Status: DC | PRN
  Administered 2023-03-01 – 2023-03-02 (×2): 5 mg via ORAL
  Filled 2023-03-01 (×2): qty 1

## 2023-03-01 MED ORDER — SIMETHICONE 80 MG PO CHEW: 80.0000 mg | CHEWABLE_TABLET | ORAL | Status: DC | PRN

## 2023-03-01 MED ORDER — FENTANYL-BUPIVACAINE-NACL 0.5-0.125-0.9 MG/250ML-% EP SOLN: 12.0000 mL/h | EPIDURAL | Status: DC | PRN

## 2023-03-01 MED ORDER — OXYTOCIN-SODIUM CHLORIDE 30-0.9 UT/500ML-% IV SOLN
2.5000 [IU]/h | INTRAVENOUS | Status: DC
  Administered 2023-03-01: 2.5 [IU]/h via INTRAVENOUS
  Filled 2023-03-01: qty 500

## 2023-03-01 MED ORDER — LIDOCAINE-EPINEPHRINE (PF) 1.5 %-1:200000 IJ SOLN
INTRAMUSCULAR | Status: DC | PRN
  Administered 2023-03-01: 3 mL via EPIDURAL

## 2023-03-01 MED ORDER — LACTATED RINGERS IV SOLN: INTRAVENOUS | Status: DC

## 2023-03-01 MED ORDER — MISOPROSTOL 200 MCG PO TABS
ORAL_TABLET | ORAL | Status: AC
  Filled 2023-03-01: qty 4

## 2023-03-01 MED ORDER — DIBUCAINE (PERIANAL) 1 % EX OINT: 1.0000 | TOPICAL_OINTMENT | CUTANEOUS | Status: DC | PRN

## 2023-03-01 MED ORDER — ONDANSETRON HCL 4 MG PO TABS: 4.0000 mg | ORAL_TABLET | ORAL | Status: DC | PRN

## 2023-03-01 MED ORDER — OXYTOCIN BOLUS FROM INFUSION
333.0000 mL | Freq: Once | INTRAVENOUS | Status: AC
  Administered 2023-03-01: 333 mL via INTRAVENOUS

## 2023-03-01 MED ORDER — IBUPROFEN 600 MG PO TABS
600.0000 mg | ORAL_TABLET | Freq: Four times a day (QID) | ORAL | Status: DC
  Administered 2023-03-01 – 2023-03-02 (×5): 600 mg via ORAL
  Filled 2023-03-01 (×5): qty 1

## 2023-03-01 MED ORDER — MEDROXYPROGESTERONE ACETATE 150 MG/ML IM SUSP: 150.0000 mg | INTRAMUSCULAR | Status: DC | PRN

## 2023-03-01 MED ORDER — AMMONIA AROMATIC IN INHA
RESPIRATORY_TRACT | Status: AC
  Filled 2023-03-01: qty 10

## 2023-03-01 MED ORDER — PRENATAL MULTIVITAMIN CH
1.0000 | ORAL_TABLET | Freq: Every day | ORAL | Status: DC
  Administered 2023-03-01 – 2023-03-02 (×2): 1 via ORAL
  Filled 2023-03-01 (×2): qty 1

## 2023-03-01 MED ORDER — FLEET ENEMA 7-19 GM/118ML RE ENEM: 1.0000 | ENEMA | Freq: Every day | RECTAL | Status: DC | PRN

## 2023-03-01 MED ORDER — EPHEDRINE 5 MG/ML INJ: 10.0000 mg | INTRAVENOUS | Status: DC | PRN

## 2023-03-01 MED ORDER — SOD CITRATE-CITRIC ACID 500-334 MG/5ML PO SOLN: 30.0000 mL | ORAL | Status: DC | PRN

## 2023-03-01 MED ORDER — BISACODYL 10 MG RE SUPP: 10.0000 mg | Freq: Every day | RECTAL | Status: DC | PRN

## 2023-03-01 MED ORDER — LACTATED RINGERS IV SOLN
500.0000 mL | Freq: Once | INTRAVENOUS | Status: AC
  Administered 2023-03-01: 500 mL via INTRAVENOUS

## 2023-03-01 MED ORDER — FENTANYL-BUPIVACAINE-NACL 0.5-0.125-0.9 MG/250ML-% EP SOLN
EPIDURAL | Status: AC
  Filled 2023-03-01: qty 250

## 2023-03-01 MED ORDER — SODIUM CHLORIDE 0.9% FLUSH: 3.0000 mL | INTRAVENOUS | Status: DC | PRN

## 2023-03-01 MED ORDER — SODIUM CHLORIDE 0.9% FLUSH
3.0000 mL | Freq: Two times a day (BID) | INTRAVENOUS | Status: DC
  Administered 2023-03-01: 3 mL via INTRAVENOUS

## 2023-03-01 NOTE — H&P (Signed)
OB History & Physical   History of Present Illness:  Chief Complaint: painful UCs   HPI:  Elizibeth Frutos is a 22 y.o. G1P0 female at 6w1ddated by LMP and c/w [place]at 168w0dEDD 02/21/23.  She presents to L&D for labor.  Active FM; onset of ctx @ 0130 currently every 3 minutes; has felt small amounts of leaking since Tuesday but no large gush of fluid. Bloody show noted intermittently.     Pregnancy Issues: 1. Hx Mental health: depression and anxiety, no meds 2. Iron deficiency anemia, irons infusion 3. Pos chlamydia at NOB, neg TOC 4. Teen pregnancy, [age]yoMaternal Medical History:   Past Medical History:  Diagnosis Date   Asthma    Depression     Past Surgical History:  Procedure Laterality Date   tooth extraction      Allergies  Allergen Reactions   Tylenol [Acetaminophen] Hives   Penicillins Hives   Tomato     Prior to Admission medications   Medication Sig Start Date End Date Taking? Authorizing Provider  ferrous sulfate 324 MG TBEC Take 324 mg by mouth daily with breakfast.   Yes [provider]  azithromycin (ZITHROMAX) 250 MG tablet Take 2 tabs PO x 1 dose, then 1 tab PO QD x 4 days Patient not taking: Reported on 02/13/2023 06/02/22   HaScot JunNP  dicyclomine (BENTYL) 10 MG capsule Take 1 capsule (10 mg total) by mouth 4 (four) times daily for 5 days. 05/04/21 05/09/21  SmLucrezia StarchMD  escitalopram (LEXAPRO) 10 MG tablet Take 10 mg by mouth daily. Patient not taking: Reported on 03/01/2023    [provider]  escitalopram (LEXAPRO) 20 MG tablet Take 1 tablet by mouth daily. Patient not taking: Reported on 10/26/2022 02/01/21   [provider]  levocetirizine (XYZAL) 5 MG tablet Take 1 tablet (5 mg total) by mouth every evening. Patient not taking: Reported on 03/01/2023 06/02/22   HaScot JunNP  magic mouthwash w/lidocaine SOLN Take 5 mLs by mouth 4 (four) times daily as needed for mouth pain. Patient not taking:  Reported on 03/01/2023 01/07/17   BrGregor HamsMD  ofloxacin (OCUFLOX) 0.3 % ophthalmic solution Place 1 drop into both eyes 4 (four) times daily. Patient not taking: Reported on 03/01/2023 06/05/22   TaSharion BalloonNP  ondansetron (ZOFRAN) 4 MG tablet Take 1 tablet (4 mg total) by mouth every 8 (eight) hours as needed for up to 10 doses for nausea or vomiting. Patient not taking: Reported on 03/01/2023 05/04/21   SmLucrezia StarchMD  Prenatal Vit-Fe Fumarate-FA (PRENATAL VITAMINS PO) Take by mouth. Patient not taking: Reported on 03/01/2023    [provider]  ZAFEMY 150-35 MCG/24HR transdermal patch Place onto the skin. Patient not taking: Reported on 03/01/2023 04/01/21   [provider]     [facility]: She  reports that she has never smoked. She has never used smokeless tobacco. She reports that she does not drink alcohol and does not use drugs.  Family History: no family hx Gyn cancers  Review of Systems: A full review of systems was performed and negative except as noted in the HPI.     Physical Exam:  Vital Signs: Ht '5\' 3"'$  (1.6 m)   Wt 75.3 kg   LMP 05/19/2022   BMI 29.41 kg/m   General: no acute distress.  HEENT: normocephalic, atraumatic Heart: regular rate &  rhythm.  No murmurs/rubs/gallops Lungs: clear to auscultation bilaterally, normal respiratory effort Abdomen: soft, gravid, non-tender;  EFW: 7lbs Pelvic:   External: Normal external female genitalia  Cervix: Dilation: 6 / Effacement (%): 80 / Station: 0    Extremities: non-tender, symmetric, no edema bilaterally.  DTRs: 2+  Neurologic: Alert & oriented x 3.    Results for orders placed or performed during the hospital encounter of 03/01/23 (from the past 24 hour(s))  CBC with Differential/Platelet     Status: Abnormal   Collection Time: 03/01/23  6:53 AM  Result Value Ref Range   WBC 16.1 (H) 4.0 - 10.5 K/uL   RBC 3.79 (L) 3.87 - 5.11 MIL/uL    Hemoglobin 10.7 (L) 12.0 - 15.0 g/dL   HCT 32.8 (L) 36.0 - 46.0 %   MCV 86.5 80.0 - 100.0 fL   MCH 28.2 26.0 - 34.0 pg   MCHC 32.6 30.0 - 36.0 g/dL   RDW 16.9 (H) 11.5 - 15.5 %   Platelets 288 150 - 400 K/uL   nRBC 0.0 0.0 - 0.2 %   Neutrophils Relative % 82 %   Neutro Abs 13.4 (H) 1.7 - 7.7 K/uL   Lymphocytes Relative 9 %   Lymphs Abs 1.4 0.7 - 4.0 K/uL   Monocytes Relative 7 %   Monocytes Absolute 1.1 (H) 0.1 - 1.0 K/uL   Eosinophils Relative 1 %   Eosinophils Absolute 0.1 0.0 - 0.5 K/uL   Basophils Relative 0 %   Basophils Absolute 0.0 0.0 - 0.1 K/uL   Immature Granulocytes 1 %   Abs Immature Granulocytes 0.09 (H) 0.00 - 0.07 K/uL    Pertinent Results:  Prenatal Labs: Blood type/Rh O POS  Antibody screen neg  Rubella Immune  Varicella Immune  RPR NR  HBsAg Neg  HIV NR  GC neg  Chlamydia neg  Genetic screening negative  1 hour GTT  88  3 hour GTT   GBS  Neg   FHT: 135bpm, mod var, + accels, no decels TOCO: q3-57mn SVE:  Dilation: 6 / Effacement (%): 80 / Station: 0    Cephalic by leopolds/SVE  No results found.  Assessment:  KShelisa Gristis a 22y.o. G1P0 female at 440w1dith active labor.   Plan:  1. Admit to Labor & Delivery; consents reviewed and obtained - Dr beLeafy Rootified of admission  2. Fetal Well being  - Fetal Tracing: Cat I - Group B Streptococcus ppx indicated: Neg - Presentation: cephalic confirmed by exam   3. Routine OB: - Prenatal labs reviewed, as above - Rh O POS - CBC, T&S, RPR on admit - Clear fluids, IVF  4. Monitoring of Labor -  Contractions: external toco in place -  Pelvis adequate for TOL -  Plan for augment if labor progress slows.  -  Plan for continuous fetal monitoring  -  Maternal pain control as desired; requesting regional anesthesia - Anticipate vaginal delivery  5. Post Partum Planning: - Infant feeding: breast - Contraception: Depo - Tdap 11/2022 - RSV 12/2022 - Flu declined  ReFrancetta Found CNM 03/01/23 7:07 AM

## 2023-03-01 NOTE — Anesthesia Procedure Notes (Signed)
Epidural Patient location during procedure: OB Start time: 03/01/2023 7:33 AM End time: 03/01/2023 7:40 AM  Staffing Anesthesiologist: Arita Miss, MD Resident/CRNA: Aline Brochure, CRNA Performed: anesthesiologist   Preanesthetic Checklist Completed: patient identified, IV checked, site marked, risks and benefits discussed, surgical consent, monitors and equipment checked, pre-op evaluation and timeout performed  Epidural Patient position: sitting Prep: ChloraPrep Patient monitoring: heart rate, continuous pulse ox and blood pressure Approach: midline Location: L3-L4 Injection technique: LOR air  Needle:  Needle type: Tuohy  Needle gauge: 17 G Needle length: 9 cm and 9 Needle insertion depth: 6 cm Catheter type: closed end flexible Catheter size: 19 Gauge Catheter at skin depth: 10 cm Test dose: negative and 1.5% lidocaine with Epi 1:200 K  Assessment Sensory level: T10 Events: blood not aspirated, no cerebrospinal fluid, injection not painful, no injection resistance, no paresthesia and negative IV test  Additional Notes 1 attempt Pt. Evaluated and documentation done after procedure finished. Patient identified. Risks/Benefits/Options discussed with patient including but not limited to bleeding, infection, nerve damage, paralysis, failed block, incomplete pain control, headache, blood pressure changes, nausea, vomiting, reactions to medication both or allergic, itching and postpartum back pain. Confirmed with bedside nurse the patient's most recent platelet count. Confirmed with patient that they are not currently taking any anticoagulation, have any bleeding history or any family history of bleeding disorders. Patient expressed understanding and wished to proceed. All questions were answered. Sterile technique was used throughout the entire procedure. Please see nursing notes for vital signs. Test dose was given through epidural catheter and negative prior to continuing to  dose epidural or start infusion. Warning signs of high block given to the patient including shortness of breath, tingling/numbness in hands, complete motor block, or any concerning symptoms with instructions to call for help. Patient was given instructions on fall risk and not to get out of bed. All questions and concerns addressed with instructions to call with any issues or inadequate analgesia.    Patient tolerated the insertion well without immediate complications.Reason for block:procedure for pain

## 2023-03-01 NOTE — Discharge Summary (Signed)
Obstetrical Discharge Summary  Patient Name: Jamie Lindsey DOB: 08-28-2001 MRN: [id number]  Date of Admission: 03/01/2023 Date of Delivery: 03/01/23 Delivered by: Avelino Leeds, CNM  Date of Discharge: 03/02/2023  Primary OB: [facility] [id number] last menstrual period was 05/19/2022. EDC Estimated Date of Delivery: 02/21/23 Gestational Age at Delivery: [redacted]w[redacted]d   Antepartum complications:  1. Hx Mental health: depression and anxiety, no meds 2. Iron deficiency anemia, irons infusion 3. Pos chlamydia at NOB, neg TOC 4. Teen pregnancy, 22yo  Admitting Diagnosis: Post-term pregnancy, 40-42 weeks of gestation [O48.0]  Secondary Diagnosis: Patient Active Problem List   Diagnosis Date Noted   Post-term pregnancy, 40-42 weeks of gestation 03/01/2023   Labor and delivery, indication for care 10/26/2022   Encounter for supervision of normal first pregnancy in third trimester 07/17/2022    Discharge Diagnosis: Term Pregnancy Delivered      Augmentation: N/A Complications: None Intrapartum complications/course: Jamie Lindsey presented to L&D with spontaneous labor. She was 6/80/0. She progressed  to C/C/+2 with a spontaneous urge to push.  She pushed  effectively over approximately 8 minutes for a spontaneous vaginal birth. Delivery Type: spontaneous vaginal delivery Anesthesia: epidural anesthesia Placenta: spontaneous To Pathology: No  Laceration: 1st degree, 2nd degree, vaginal, and labial Episiotomy: none Newborn Data: Live born female  Birth Weight: 7 lb 0.2 oz (3180 g) APGAR: 7, 9  Newborn Delivery   Birth date/time: 03/01/2023 09:00:00 Delivery type: Vaginal, Spontaneous      Postpartum Procedures: none [place]:     03/02/2023   12:36 AM  Flavia Shipper Postnatal Depression Scale Screening Tool  I have been able to laugh and see the funny side of things. 0  I have looked forward with enjoyment to things. 0  I have blamed myself unnecessarily when things went  wrong. 0  I have been anxious or worried for no good reason. 0  I have felt scared or panicky for no good reason. 0  Things have been getting on top of me. 0  I have been so unhappy that I have had difficulty sleeping. 0  I have felt sad or miserable. 0  I have been so unhappy that I have been crying. 0  The thought of harming myself has occurred to me. 0  [place] Postnatal Depression Scale Total 0     Post partum course:  Patient had an uncomplicated postpartum course.  By time of discharge on PPD#1, her pain was controlled on oral pain medications; she had appropriate lochia and was ambulating, voiding without difficulty and tolerating regular diet.  She was deemed stable for discharge to home.    Discharge Physical Exam:  BP 107/62 (BP Location: Left Arm)   Pulse 77   Temp 98.3 F (36.8 C) (Oral)   Resp 20   Ht 5\' 3"  (1.6 m)   Wt 75.3 kg   LMP 05/19/2022   SpO2 100%   Breastfeeding Unknown   BMI 29.41 kg/m   General: NAD CV: RRR Pulm: CTABL, nl effort ABD: s/nd/nt, fundus firm and below the umbilicus Lochia: moderate Perineum: minimal edema/repair well approximated DVT Evaluation: LE non-ttp, no evidence of DVT on exam.  Hemoglobin  Date Value Ref Range Status  03/02/2023 8.9 (L) 12.0 - 15.0 g/dL Final   HCT  Date Value Ref Range Status  03/02/2023 28.7 (L) 36.0 - 46.0 % Final    Risk assessment for postpartum VTE and prophylactic treatment: Very high risk factors: None High risk factors: None Moderate risk factors: None  Postpartum VTE  prophylaxis with LMWH not indicated  Disposition: stable, discharge to home. Baby Feeding: formula feeding Baby Disposition: home with mom  Rh Immune globulin indicated: No Rubella vaccine given: was not indicated Varivax vaccine given: was not indicated Flu vaccine given in AP setting: declined Tdap vaccine given in AP setting: Yes   Contraception: Depo-Provera  Prenatal Labs:   Blood type/Rh O POS  Antibody  screen neg  Rubella Immune  Varicella Immune  RPR NR  HBsAg Neg  HIV NR  GC neg  Chlamydia neg  Genetic screening negative  1 hour GTT  88  3 hour GTT    GBS  Neg     Plan:  Jamie Lindsey was discharged to home in good condition. Follow-up appointment with delivering provider in 2 weeks for mood check and 6 weeks for postpartum visit  Discharge Medications: Allergies as of 03/02/2023       Reactions   Tylenol [acetaminophen] Hives   Penicillins Hives   Tomato         Medication List     STOP taking these medications    azithromycin 250 MG tablet Commonly known as: ZITHROMAX   dicyclomine 10 MG capsule Commonly known as: Bentyl   escitalopram 10 MG tablet Commonly known as: LEXAPRO   escitalopram 20 MG tablet Commonly known as: LEXAPRO   ferrous sulfate 324 MG Tbec   levocetirizine 5 MG tablet Commonly known as: XYZAL   magic mouthwash w/lidocaine Soln   ofloxacin 0.3 % ophthalmic solution Commonly known as: Ocuflox   ondansetron 4 MG tablet Commonly known as: Zofran   PRENATAL VITAMINS PO   Zafemy 150-35 MCG/24HR transdermal patch Generic drug: norelgestromin-ethinyl estradiol       TAKE these medications    ibuprofen 600 MG tablet Commonly known as: ADVIL Take 1 tablet (600 mg total) by mouth every 6 (six) hours as needed for mild pain or cramping.         Follow-up Information     Avelino Leeds Arlyn Leak, CNM. Go on 03/16/2023.   Specialty: Obstetrics Why: mood check @9 :45 am Contact information: [place] [address] [phone]         Ed Blalock, CNM. Go on 04/16/2023.   Specialty: Obstetrics Why: pp visit @ 9:45 am Contact information: [place] [address] [phone]                 Signed:  Drinda Butts, CNM Certified Nurse Midwife [facility]

## 2023-03-01 NOTE — Plan of Care (Signed)
  Problem: Education: Goal: Knowledge of General Education information will improve Description: Including pain rating scale, medication(s)/side effects and non-pharmacologic comfort measures Outcome: Progressing   Problem: Health Behavior/Discharge Planning: Goal: Ability to manage health-related needs will improve Outcome: Progressing   Problem: Clinical Measurements: Goal: Ability to maintain clinical measurements within normal limits will improve Outcome: Progressing Goal: Will remain free from infection Outcome: Progressing Goal: Diagnostic test results will improve Outcome: Progressing Goal: Respiratory complications will improve Outcome: Progressing Goal: Cardiovascular complication will be avoided Outcome: Progressing   Problem: Activity: Goal: Risk for activity intolerance will decrease Outcome: Progressing   Problem: Nutrition: Goal: Adequate nutrition will be maintained Outcome: Progressing   Problem: Coping: Goal: Level of anxiety will decrease Outcome: Progressing   Problem: Elimination: Goal: Will not experience complications related to bowel motility Outcome: Progressing Goal: Will not experience complications related to urinary retention Outcome: Progressing   Problem: Pain Managment: Goal: General experience of comfort will improve Outcome: Progressing   Problem: Safety: Goal: Ability to remain free from injury will improve Outcome: Progressing   Problem: Skin Integrity: Goal: Risk for impaired skin integrity will decrease Outcome: Progressing   Problem: Education: Goal: Knowledge of condition will improve Outcome: Progressing Goal: Individualized Educational Video(s) Outcome: Progressing Goal: Individualized Newborn Educational Video(s) Outcome: Progressing   Problem: Activity: Goal: Will verbalize the importance of balancing activity with adequate rest periods Outcome: Progressing Goal: Ability to tolerate increased activity will  improve Outcome: Progressing   Problem: Coping: Goal: Ability to identify and utilize available resources and services will improve Outcome: Progressing   Problem: Life Cycle: Goal: Chance of risk for complications during the postpartum period will decrease Outcome: Progressing   Problem: Role Relationship: Goal: Ability to demonstrate positive interaction with newborn will improve Outcome: Progressing   Problem: Skin Integrity: Goal: Demonstration of wound healing without infection will improve Outcome: Progressing   Problem: Education: Goal: Knowledge of Childbirth will improve Outcome: Completed/Met Goal: Ability to make informed decisions regarding treatment and plan of care will improve Outcome: Completed/Met Goal: Ability to state and carry out methods to decrease the pain will improve Outcome: Completed/Met Goal: Individualized Educational Video(s) Outcome: Completed/Met   Problem: Coping: Goal: Ability to verbalize concerns and feelings about labor and delivery will improve Outcome: Completed/Met   Problem: Life Cycle: Goal: Ability to make normal progression through stages of labor will improve Outcome: Completed/Met Goal: Ability to effectively push during vaginal delivery will improve Outcome: Completed/Met   Problem: Role Relationship: Goal: Will demonstrate positive interactions with the child Outcome: Completed/Met   Problem: Safety: Goal: Risk of complications during labor and delivery will decrease Outcome: Completed/Met   Problem: Pain Management: Goal: Relief or control of pain from uterine contractions will improve Outcome: Completed/Met

## 2023-03-01 NOTE — Anesthesia Preprocedure Evaluation (Signed)
Anesthesia Evaluation  Patient identified by MRN, date of birth, ID band Patient awake    Reviewed: Allergy & Precautions, H&P , NPO status , Patient's Chart, lab work & pertinent test results  History of Anesthesia Complications Negative for: history of anesthetic complications  Airway Mallampati: I       Dental no notable dental hx. (+) Teeth Intact   Pulmonary asthma           Cardiovascular      Neuro/Psych  PSYCHIATRIC DISORDERS  Depression       GI/Hepatic   Endo/Other    Renal/GU      Musculoskeletal   Abdominal   Peds  Hematology   Anesthesia Other Findings   Reproductive/Obstetrics (+) Pregnancy                             Anesthesia Physical Anesthesia Plan  ASA: 2  Anesthesia Plan: Epidural   Post-op Pain Management:    Induction:   PONV Risk Score and Plan:   Airway Management Planned:   Additional Equipment:   Intra-op Plan:   Post-operative Plan:   Informed Consent:   Plan Discussed with: Anesthesiologist  Anesthesia Plan Comments:        Anesthesia Quick Evaluation

## 2023-03-02 LAB — CBC
HCT: 28.7 % — ABNORMAL LOW (ref 36.0–46.0)
Hemoglobin: 8.9 g/dL — ABNORMAL LOW (ref 12.0–15.0)
MCH: 27.6 pg (ref 26.0–34.0)
MCHC: 31 g/dL (ref 30.0–36.0)
MCV: 88.9 fL (ref 80.0–100.0)
Platelets: 248 10*3/uL (ref 150–400)
RBC: 3.23 MIL/uL — ABNORMAL LOW (ref 3.87–5.11)
RDW: 17.3 % — ABNORMAL HIGH (ref 11.5–15.5)
WBC: 14 10*3/uL — ABNORMAL HIGH (ref 4.0–10.5)
nRBC: 0 % (ref 0.0–0.2)

## 2023-03-02 MED ORDER — IBUPROFEN 600 MG PO TABS: 600.0000 mg | ORAL_TABLET | Freq: Four times a day (QID) | ORAL | 0 refills | Status: AC | PRN

## 2023-03-02 NOTE — Discharge Instructions (Signed)
Vaginal Delivery, Care After Refer to this sheet in the next few weeks. These discharge instructions provide you with information on caring for yourself after delivery. Your caregiver may also give you specific instructions. Your treatment has been planned according to the most current medical practices available, but problems sometimes occur. Call your caregiver if you have any problems or questions after you go home. HOME CARE INSTRUCTIONS Take over-the-counter or prescription medicines only as directed by your caregiver or pharmacist. Do not drink alcohol, especially if you are breastfeeding or taking medicine to relieve pain. Do not smoke tobacco. Continue to use good perineal care. Good perineal care includes: Wiping your perineum from back to front Keeping your perineum clean. You can do sitz baths twice a day, to help keep this area clean Do not use tampons, douche or have sex until your caregiver says it is okay. Shower only and avoid sitting in submerged water, aside from sitz baths Wear a well-fitting bra that provides breast support. Eat healthy foods. Drink enough fluids to keep your urine clear or pale yellow. Eat high-fiber foods such as whole grain cereals and breads, brown rice, beans, and fresh fruits and vegetables every day. These foods may help prevent or relieve constipation. Avoid constipation with high fiber foods or medications, such as miralax or metamucil Follow your caregiver's recommendations regarding resumption of activities such as climbing stairs, driving, lifting, exercising, or traveling. Talk to your caregiver about resuming sexual activities. Resumption of sexual activities is dependent upon your risk of infection, your rate of healing, and your comfort and desire to resume sexual activity. Try to have someone help you with your household activities and your newborn for at least a few days after you leave the hospital. Rest as much as possible. Try to rest or  take a nap when your newborn is sleeping. Increase your activities gradually. Keep all of your scheduled postpartum appointments. It is very important to keep your scheduled follow-up appointments. At these appointments, your caregiver will be checking to make sure that you are healing physically and emotionally. SEEK MEDICAL CARE IF:  You are passing large clots from your vagina. Save any clots to show your caregiver. You have a foul smelling discharge from your vagina. You have trouble urinating. You are urinating frequently. You have pain when you urinate. You have a change in your bowel movements. You have increasing redness, pain, or swelling near your vaginal incision (episiotomy) or vaginal tear. You have pus draining from your episiotomy or vaginal tear. Your episiotomy or vaginal tear is separating. You have painful, hard, or reddened breasts. You have a severe headache. You have blurred vision or see spots. You feel sad or depressed. You have thoughts of hurting yourself or your newborn. You have questions about your care, the care of your newborn, or medicines. You are dizzy or light-headed. You have a rash. You have nausea or vomiting. You were breastfeeding and have not had a menstrual period within 12 weeks after you stopped breastfeeding. You are not breastfeeding and have not had a menstrual period by the 12th week after delivery. You have a fever. SEEK IMMEDIATE MEDICAL CARE IF:  You have persistent pain. You have chest pain. You have shortness of breath. You faint. You have leg pain. You have stomach pain. Your vaginal bleeding saturates two or more sanitary pads in 1 hour. MAKE SURE YOU:  Understand these instructions. Will watch your condition. Will get help right away if you are not doing well or   get worse. Document Released: 12/08/2000 Document Revised: 04/27/2014 Document Reviewed: 08/07/2012 ExitCare Patient Information 2015 [employer]. This  information is not intended to replace advice given to you by your health care provider. Make sure you discuss any questions you have with your health care provider.  Sitz Bath A sitz bath is a warm water bath taken in the sitting position. The water covers only the hips and butt (buttocks). We recommend using one that fits in the toilet, to help with ease of use and cleanliness. It may be used for either healing or cleaning purposes. Sitz baths are also used to relieve pain, itching, or muscle tightening (spasms). The water may contain medicine. Moist heat will help you heal and relax.  HOME CARE  Take 3 to 4 sitz baths a day. Fill the bathtub half-full with warm water. Sit in the water and open the drain a little. Turn on the warm water to keep the tub half-full. Keep the water running constantly. Soak in the water for 15 to 20 minutes. After the sitz bath, pat the affected area dry. GET HELP RIGHT AWAY IF: You get worse instead of better. Stop the sitz baths if you get worse. MAKE SURE YOU: Understand these instructions. Will watch your condition. Will get help right away if you are not doing well or get worse. Document Released: 01/18/2005 Document Revised: 09/04/2012 Document Reviewed: 04/10/2011 ExitCare Patient Information 2015 [employer]. This information is not intended to replace advice given to you by your health care provider. Make sure you discuss any questions you have with your health care provider.     

## 2023-03-02 NOTE — Anesthesia Postprocedure Evaluation (Signed)
Anesthesia Post Note  Patient: Jamie Lindsey  Procedure(s) Performed: AN AD HOC LABOR EPIDURAL  Patient location during evaluation: Mother Baby Anesthesia Type: Epidural Level of consciousness: awake and alert Pain management: pain level controlled Vital Signs Assessment: post-procedure vital signs reviewed and stable Respiratory status: spontaneous breathing, nonlabored ventilation and respiratory function stable Cardiovascular status: stable Postop Assessment: no headache, no backache, epidural receding and able to ambulate Anesthetic complications: no  No notable events documented.   Last Vitals:  Vitals:   03/02/23 0414 03/02/23 0902  BP: (!) 98/44 107/62  Pulse: 78 77  Resp: 20 20  Temp: (!) 36.4 C 36.8 C  SpO2: 100% 100%    Last Pain:  Vitals:   03/02/23 0902  TempSrc: Oral  PainSc:                  Caryl Asp

## 2023-03-02 NOTE — Progress Notes (Signed)
Patient discharged home with infant. Discharge instructions and prescriptions given and reviewed with patient. Patient verbalized understanding. Escorted out by staff.

## 2023-04-02 ENCOUNTER — Telehealth: Payer: Self-pay

## 2023-04-02 NOTE — Telephone Encounter (Signed)
[facility]- Discharge Call [facility] with pt about the following below. 1-Do you have any questions or concerns about yourself as you heal?No 2-Any concerns or questions about your baby?No 3-Reviewed ABC's of safe sleep. 4-How was your stay at the hospital? 10 out of 10! 5-How did our team work together to care for you?Yes You should be receiving a survey in the mail soon.   We would really appreciate it if you could fill that out for [place] and return it in the mail.  We value the feedback to make improvements and continue the great work we do.   If you have any questions please feel free to call me back at [phone]

## 2024-03-27 ENCOUNTER — Other Ambulatory Visit: Payer: Self-pay

## 2024-03-27 ENCOUNTER — Emergency Department

## 2024-03-27 ENCOUNTER — Emergency Department
Admission: EM | Admit: 2024-03-27 | Discharge: 2024-03-27 | Disposition: A | Discharge status: Home / Self Care | Attending: Emergency Medicine | Admitting: Emergency Medicine

## 2024-03-27 DIAGNOSIS — R109 Unspecified abdominal pain: Secondary | ICD-10-CM

## 2024-03-27 DIAGNOSIS — R1011 Right upper quadrant pain: Secondary | ICD-10-CM | POA: Diagnosis present

## 2024-03-27 DIAGNOSIS — R110 Nausea: Secondary | ICD-10-CM | POA: Insufficient documentation

## 2024-03-27 DIAGNOSIS — R1013 Epigastric pain: Secondary | ICD-10-CM | POA: Diagnosis not present

## 2024-03-27 DIAGNOSIS — R1031 Right lower quadrant pain: Secondary | ICD-10-CM | POA: Diagnosis not present

## 2024-03-27 LAB — URINALYSIS, ROUTINE W REFLEX MICROSCOPIC
Bacteria, UA: NONE SEEN
Bilirubin Urine: NEGATIVE
Glucose, UA: NEGATIVE mg/dL
Ketones, ur: NEGATIVE mg/dL
Leukocytes,Ua: NEGATIVE
Nitrite: NEGATIVE
Protein, ur: NEGATIVE mg/dL
Specific Gravity, Urine: 1.012 (ref 1.005–1.030)
pH: 7 (ref 5.0–8.0)

## 2024-03-27 LAB — BASIC METABOLIC PANEL WITH GFR
Anion gap: 7 (ref 5–15)
BUN: 15 mg/dL (ref 6–20)
CO2: 24 mmol/L (ref 22–32)
Calcium: 9 mg/dL (ref 8.9–10.3)
Chloride: 105 mmol/L (ref 98–111)
Creatinine, Ser: 0.66 mg/dL (ref 0.44–1.00)
GFR, Estimated: >60 mL/min (ref >60)
Glucose, Bld: 81 mg/dL (ref 70–99)
Potassium: 3.7 mmol/L (ref 3.5–5.1)
Sodium: 136 mmol/L (ref 135–145)

## 2024-03-27 LAB — CBC
HCT: 36.9 % (ref 36.0–46.0)
Hemoglobin: 12.3 g/dL (ref 12.0–15.0)
MCH: 31.8 pg (ref 26.0–34.0)
MCHC: 33.3 g/dL (ref 30.0–36.0)
MCV: 95.3 fL (ref 80.0–100.0)
Platelets: 309 10*3/uL (ref 150–400)
RBC: 3.87 MIL/uL (ref 3.87–5.11)
RDW: 12.5 % (ref 11.5–15.5)
WBC: 3.9 10*3/uL — ABNORMAL LOW (ref 4.0–10.5)
nRBC: 0 % (ref 0.0–0.2)

## 2024-03-27 LAB — PREGNANCY, URINE: Preg Test, Ur: NEGATIVE

## 2024-03-27 MED ORDER — LACTATED RINGERS IV BOLUS
1000.0000 mL | Freq: Once | INTRAVENOUS | Status: AC
  Administered 2024-03-27: 1000 mL via INTRAVENOUS

## 2024-03-27 MED ORDER — KETOROLAC TROMETHAMINE 15 MG/ML IJ SOLN
15.0000 mg | Freq: Once | INTRAMUSCULAR | Status: AC
  Administered 2024-03-27: 15 mg via INTRAVENOUS
  Filled 2024-03-27: qty 1

## 2024-03-27 MED ORDER — IOHEXOL 300 MG/ML  SOLN
100.0000 mL | Freq: Once | INTRAMUSCULAR | Status: AC | PRN
  Administered 2024-03-27: 100 mL via INTRAVENOUS

## 2024-03-27 MED ORDER — DICYCLOMINE HCL 10 MG PO CAPS
10.0000 mg | ORAL_CAPSULE | Freq: Once | ORAL | Status: AC
  Administered 2024-03-27: 10 mg via ORAL
  Filled 2024-03-27: qty 1

## 2024-03-27 NOTE — ED Provider Triage Note (Signed)
 Emergency Medicine Provider Triage Evaluation Note  Jamie Lindsey , a 23 y.o. female  was evaluated in triage.  Pt complains of right lower quadrant pain sent by [facility].  Review of Systems  Positive:  Negative:   Physical Exam  There were no vitals taken for this visit. Gen:   Awake, no distress   Resp:  Normal effort  MSK:   Moves extremities without difficulty  Other:    Medical Decision Making  Medically screening exam initiated at 12:26 PM.  Appropriate orders placed.  Cody Oliger was informed that the remainder of the evaluation will be completed by another provider, this initial triage assessment does not replace that evaluation, and the importance of remaining in the ED until their evaluation is complete.     Faythe Ghee, PA-C 03/27/24 1227

## 2024-03-27 NOTE — ED Triage Notes (Signed)
 Pt here with right flank pain since yesterday. Pt endorses nausea but no vomiting or diarrhea. Pt states pain is in her RLQ.  Pt ambulatory to triage.

## 2024-03-27 NOTE — Discharge Instructions (Addendum)
 You can take 650 mg of Tylenol or 400 mg of ibuprofen every 6 hours as as needed for pain.

## 2024-03-27 NOTE — ED Provider Notes (Signed)
 [facility] Provider Note    Event Date/Time   First MD Initiated Contact with Patient 03/27/24 1547     (approximate)   History   Flank Pain   HPI  Jamie Lindsey is a 23 y.o. female with history of asthma, depression, presenting with epigastric, right upper quadrant, right lower quadrant, right flank pain.  Last bowel movement was 2 days ago.  Per grandma they have family history of kidney stones.  She denies any urinary symptoms, no nausea, vomiting, diarrhea.  Just ended her period.  On independent chart review, she was seen by primary care doctor today, had told her primary care doctor that she stood up in class, had an onset of epigastric and right upper quadrant pain, felt nauseous but did not throw up.  Thinks that urine has been darker despite keeping herself hydrated.  They sent her in for further management.   Independent history obtained from grandma as above.  Physical Exam   Triage Vital Signs: ED Triage Vitals  Encounter Vitals Group     BP 03/27/24 1228 101/60     Systolic BP Percentile --      Diastolic BP Percentile --      Pulse Rate 03/27/24 1227 70     Resp 03/27/24 1227 17     Temp 03/27/24 1227 98.2 F (36.8 C)     Temp Source 03/27/24 1227 Oral     SpO2 03/27/24 1227 100 %     Weight 03/27/24 1227 166 lb 0.1 oz (75.3 kg)     Height 03/27/24 1227 5\' 3"  (1.6 m)     Head Circumference --      Peak Flow --      Pain Score 03/27/24 1227 4     Pain Loc --      Pain Education --      Exclude from Growth Chart --     Most recent vital signs: Vitals:   03/27/24 1228 03/27/24 1633  BP: 101/60   Pulse:    Resp:    Temp:  98.2 F (36.8 C)  SpO2:       General: Awake, no distress.  CV:  Good peripheral perfusion.  Resp:  Normal effort.  No respiratory distress, no increased work of breathing Abd:  No distention.  Soft, mildly tender to the epigastric, right upper quadrant, right lower quadrant region Other:  No CVA  tenderness, no overlying rash.   ED Results / Procedures / Treatments   Labs (all labs ordered are listed, but only abnormal results are displayed) Labs Reviewed  URINALYSIS, ROUTINE W REFLEX MICROSCOPIC - Abnormal; Notable for the following components:      Result Value   Color, Urine STRAW (*)    APPearance CLEAR (*)    Hgb urine dipstick MODERATE (*)    All other components within normal limits  CBC - Abnormal; Notable for the following components:   WBC 3.9 (*)    All other components within normal limits  BASIC METABOLIC PANEL WITH GFR  PREGNANCY, URINE     RADIOLOGY CT scan on my independent interpretation without obvious free air   PROCEDURES:  Critical Care performed: No  Procedures   MEDICATIONS ORDERED IN ED: Medications  iohexol (OMNIPAQUE) 300 MG/ML solution 100 mL (100 mLs Intravenous Contrast Given 03/27/24 1324)  dicyclomine (BENTYL) capsule 10 mg (10 mg Oral Given 03/27/24 1730)  ketorolac (TORADOL) 15 MG/ML injection 15 mg (15 mg Intravenous Given 03/27/24 1706)  lactated  ringers bolus 1,000 mL (1,000 mLs Intravenous New Bag/Given 03/27/24 1705)     IMPRESSION / MDM / ASSESSMENT AND PLAN / ED COURSE  I reviewed the triage vital signs and the nursing notes.                              Differential diagnosis includes, but is not limited to, gastritis, GERD, acid reflux, biliary colic, cholecystitis, nephrolithiasis, appendicitis, colitis, viral illness.  Will get labs, UA, CT abdomen pelvis.  Patient's presentation is most consistent with acute presentation with potential threat to life or bodily function.  Independent review of labs imaging are below.  On reassessment patient still has some mild right upper quadrant abdominal pain as well as right flank pain.  Discussed results of imaging as well as labs with patient and grandmom, recommended getting a right upper quadrant ultrasound.  They are agreeable plan.  Will give her some Bentyl as well as Toradol.   She is tolerating p.o. without emesis.  On reassessment, patient states that her pain improved with the medications and then got worse, she went to PT in states that the pain improved.  Consider that she might have a very small kidney stone that is not seen on CT.  Otherwise she is stable for outpatient management.  Discussed ultrasound with patient and grandma.  Instructed her to follow-up with her primary care doctor for further management, will give her the number to call for urology to follow-up as needed.  Shared decision done with patient and she is agreeable plan for discharge.  Strict return precautions given.  Clinical Course as of 03/27/24 1809  Thu Mar 27, 2024  1613 Independent review of labs, UA is not consistent with UTI, no leukocytosis, electrolytes not severely deranged.  Pregnancy test is negative. [TT]  1614 CT ABDOMEN PELVIS W CONTRAST IMPRESSION: 1. No acute inflammatory process identified within the abdomen or pelvis. Unremarkable appendix. 2. Multiple other nonacute observations, as described above.   [TT]  1754 US ABDOMEN LIMITED RUQ (LIVER/GB) IMPRESSION: Normal study.   [TT]    Clinical Course User Index [TT] Jodie Echevaria Franchot Erichsen, MD     FINAL CLINICAL IMPRESSION(S) / ED DIAGNOSES   Final diagnoses:  Flank pain  Right upper quadrant abdominal pain  RLQ abdominal pain  Nausea     Rx / DC Orders   ED Discharge Orders          Ordered    Ambulatory Referral to Primary Care (Establish Care)        03/27/24 1808             Note:  This document was prepared using Dragon voice recognition software and may include unintentional dictation errors.    Claybon Jabs, MD 03/27/24 [phone]

## 2024-03-27 NOTE — ED Notes (Signed)
 See triage notes. Patient c/o right flank pain since yesterday. Patient has had some nausea but no diarrhea or vomiting.

## 2024-03-27 NOTE — ED Notes (Signed)
 UA sent to lab

## 2025-01-02 ENCOUNTER — Ambulatory Visit
Admission: RE | Admit: 2025-01-02 | Discharge: 2025-01-02 | Disposition: A | Discharge status: Home / Self Care | Source: Ambulatory Visit | Attending: Family Medicine | Admitting: Family Medicine

## 2025-01-02 VITALS — BP 108/75 | HR 90 | Temp 98.1°F | Resp 16

## 2025-01-02 DIAGNOSIS — R0789 Other chest pain: Secondary | ICD-10-CM

## 2025-01-02 DIAGNOSIS — M545 Low back pain, unspecified: Secondary | ICD-10-CM

## 2025-01-02 MED ORDER — TIZANIDINE HCL 4 MG PO TABS: 4.0000 mg | ORAL_TABLET | Freq: Three times a day (TID) | ORAL | 0 refills | Status: AC | PRN

## 2025-01-02 MED ORDER — NAPROXEN 500 MG PO TABS: 500.0000 mg | ORAL_TABLET | Freq: Two times a day (BID) | ORAL | 0 refills | Status: AC
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
## 2025-01-03 ENCOUNTER — Ambulatory Visit (HOSPITAL_COMMUNITY): Payer: Self-pay
